# Patient Record
Sex: Male | Born: 1960 | Race: Black or African American | Hispanic: No | Marital: Married | Smoking: Never smoker
Health system: Southern US, Community
[De-identification: ages and names within clinical notes are randomized; demographics above are authoritative.]

---

## 2004-04-29 ENCOUNTER — Inpatient Hospital Stay (HOSPITAL_COMMUNITY): Admission: EM | Admit: 2004-04-29 | Discharge: 2004-05-13 | Payer: Self-pay | Admitting: *Deleted

## 2004-11-19 IMAGING — CR DG ABDOMEN 2V
2 series · 2 of 2 positions shown · non-contrast
Comparison: none

CLINICAL DATA: abdominal distention
TWO VIEW ABDOMEN
There are distended small bowel loops seen within the mid- and upper abdomen with air-fluid levels on the erect view.  There is a small amount of gas and stool within the right colon.  The appearance is worrisome for partial small bowel obstruction.  There is an occulta spina bifida at the S1 level and also at the T12 level.
IMPRESSION
Markedly distended small bowel loops within the mid- and upper abdomen with a small amount of stool and gas within the colon- findings worrisome for partial small bowel obstruction.

[view not recorded (1 of 2)]
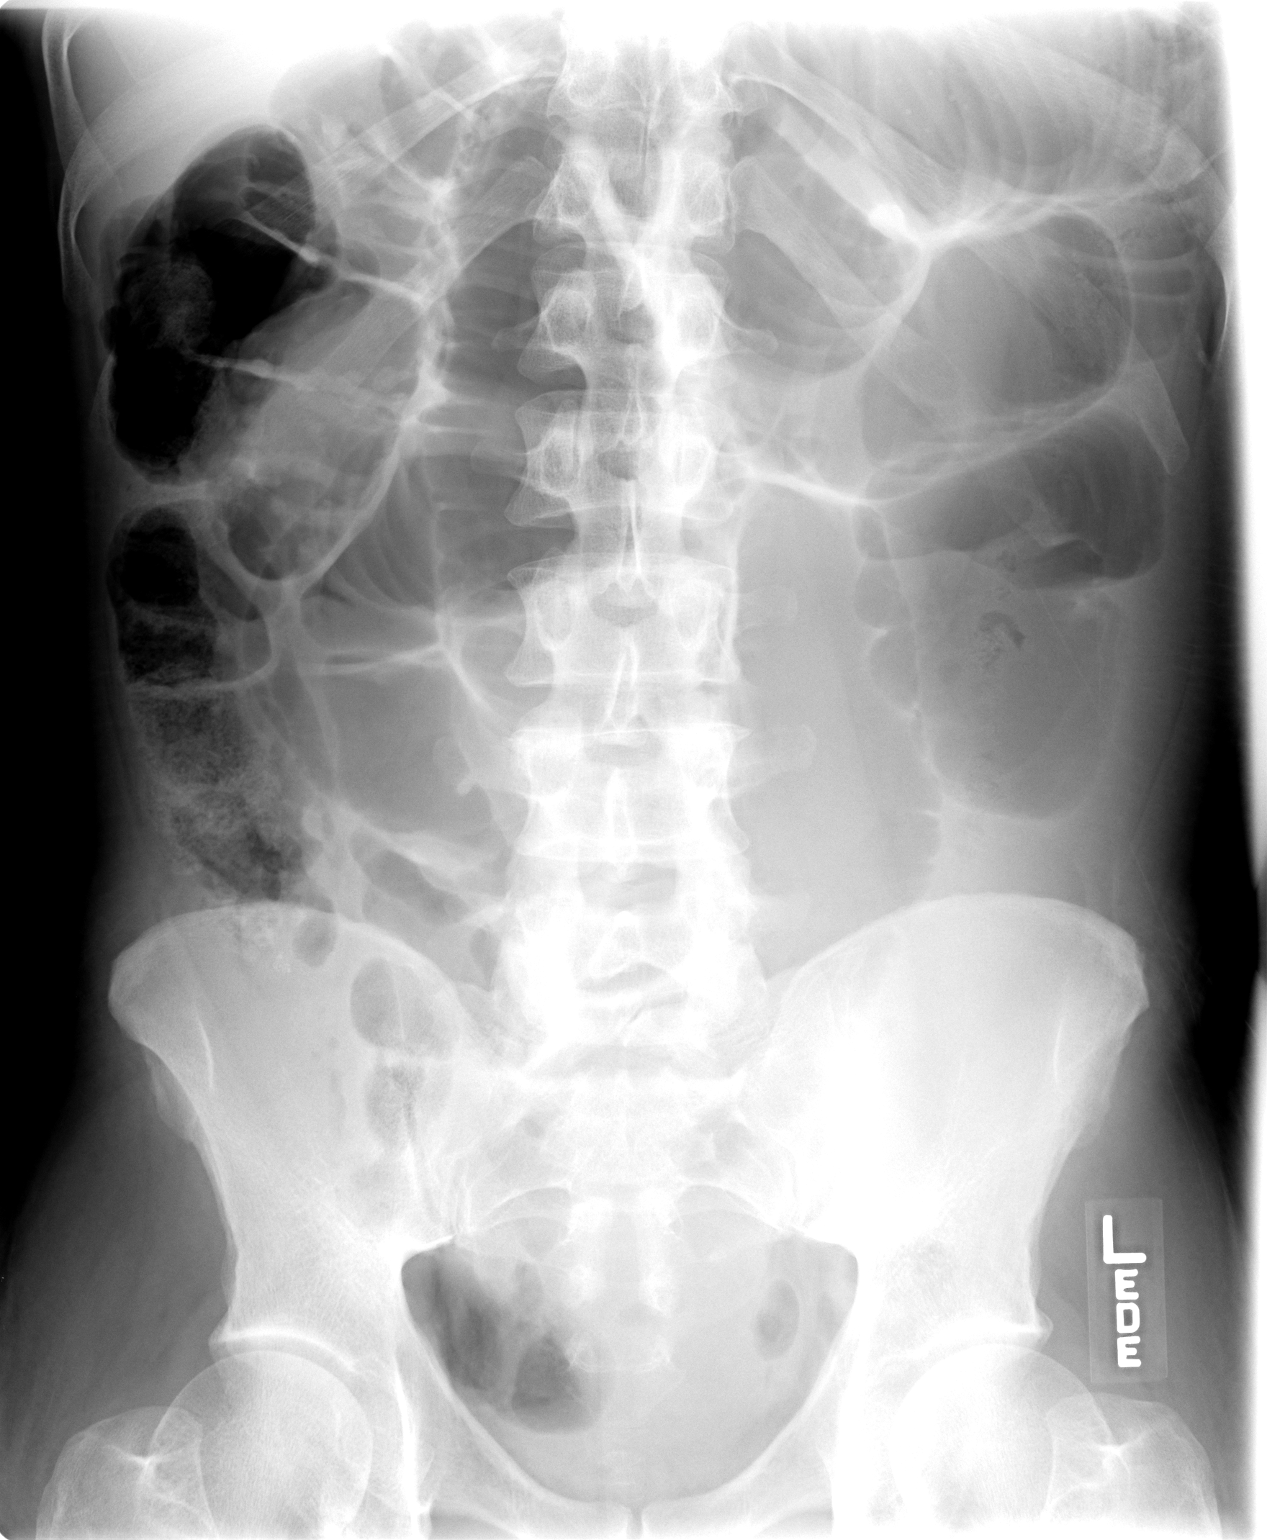

[view not recorded (2 of 2)]
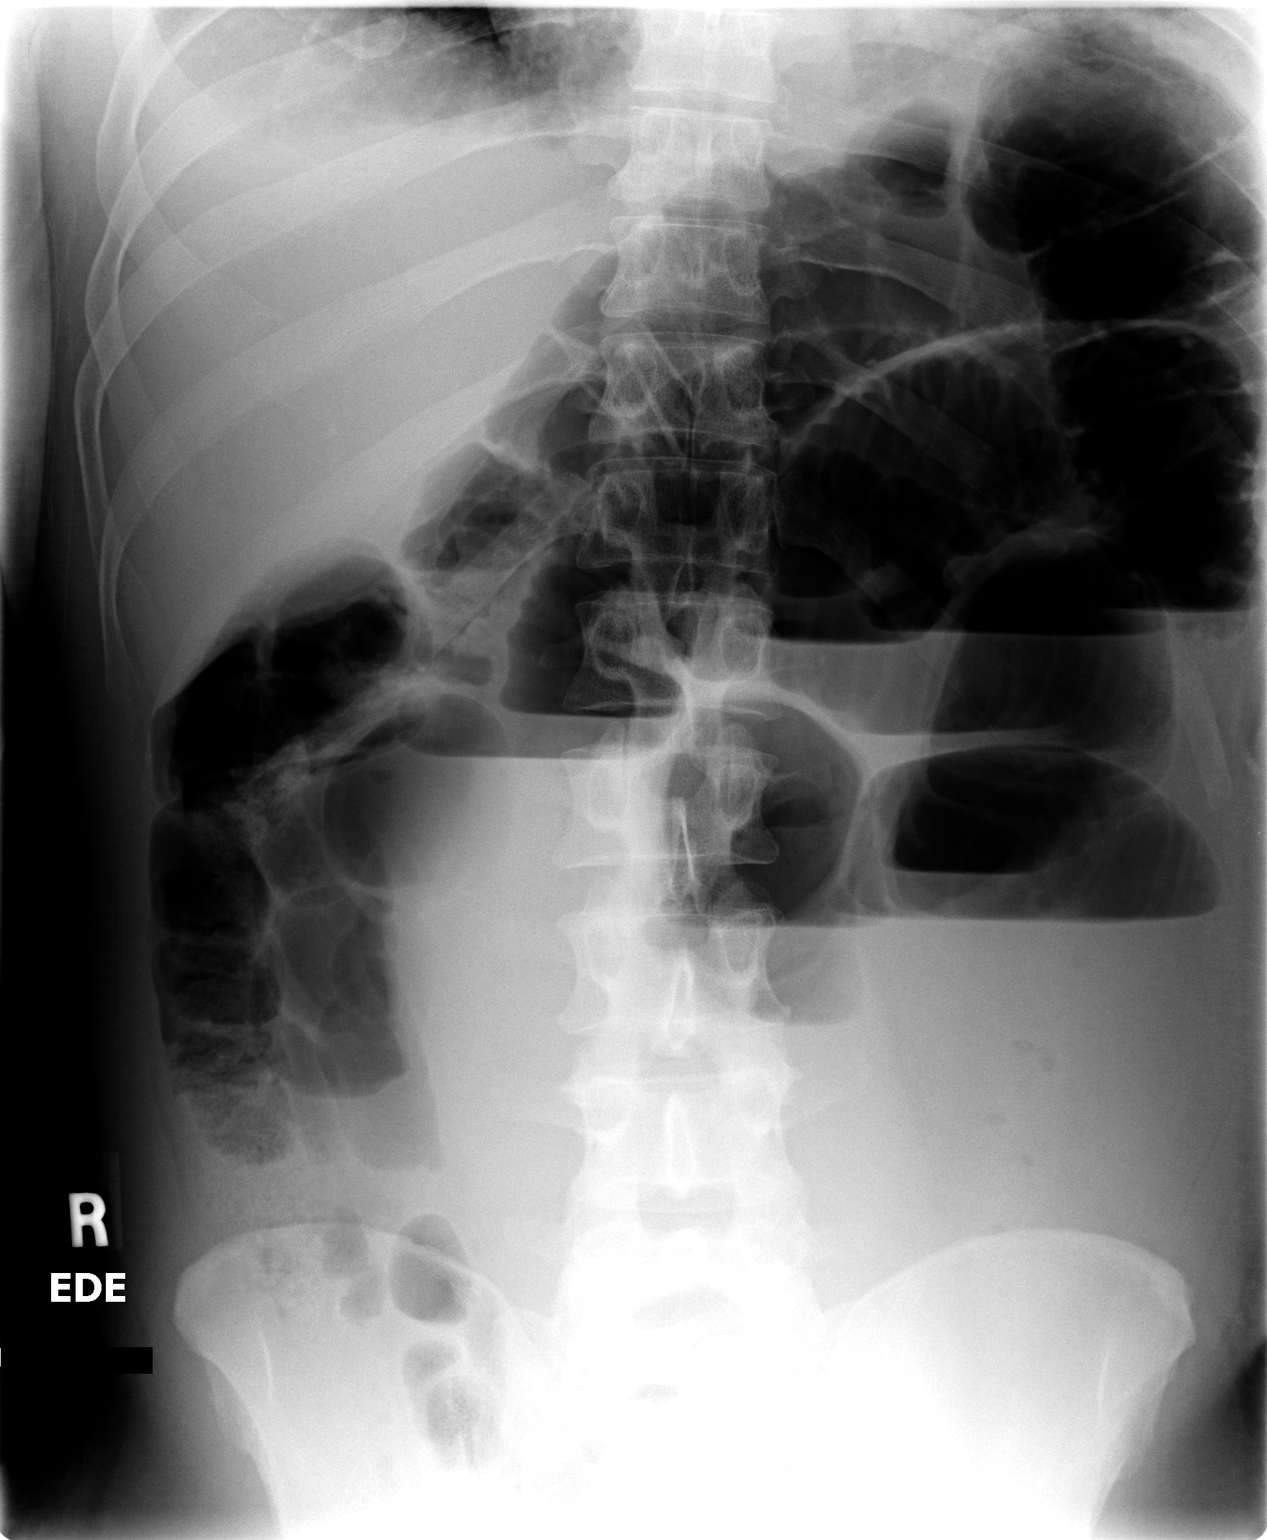

[2 of 2 positions shown; findings below may reference images not displayed]

## 2004-11-22 IMAGING — CT CT PELVIS W/ CM
1 series · 15 of 32 positions shown, 19 images · IV contrast (agent unspecified)
Comparison: none

CLINICAL DATA: Appendicitis; status-post appendectomy.  Abdominal distention. 
CT ABDOMEN WITH CONTRAST

[Series 2: abd pelvis · axial · 0.66mm/px · z∈[-461,-26]mm · 15 of 128 slices shown, 19 images]
[im 9/128  soft-tissue]
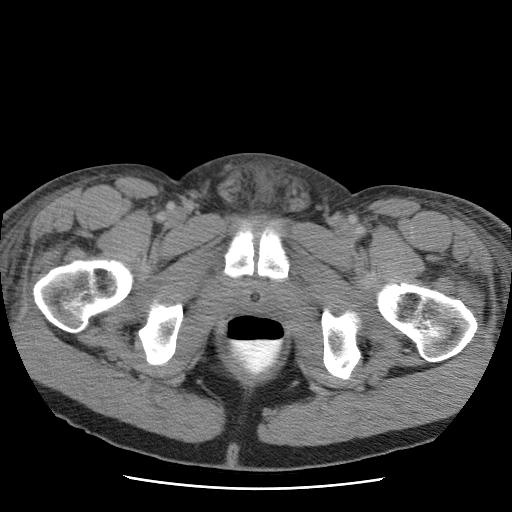
[im 9/128  bone]
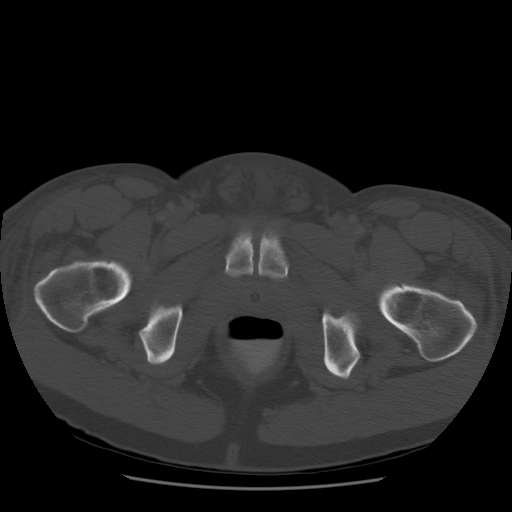
[im 17/128  soft-tissue]
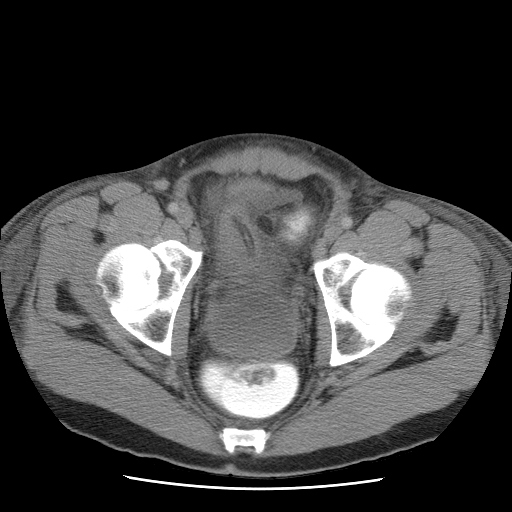
[im 25/128  soft-tissue]
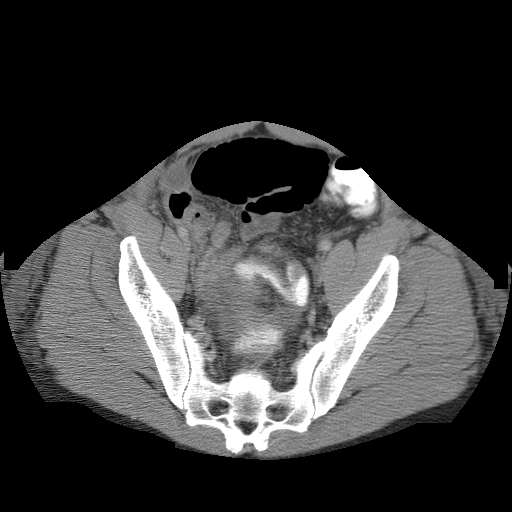
[im 37/128  soft-tissue]
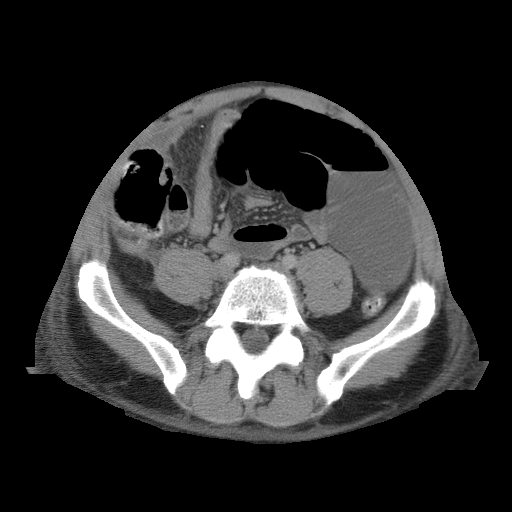
[im 46/128  soft-tissue]
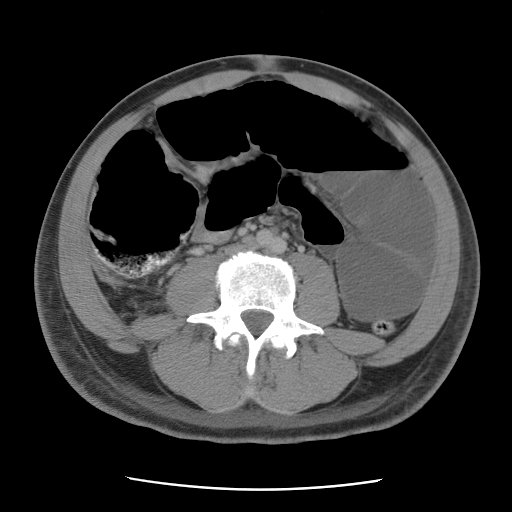
[im 54/128  soft-tissue]
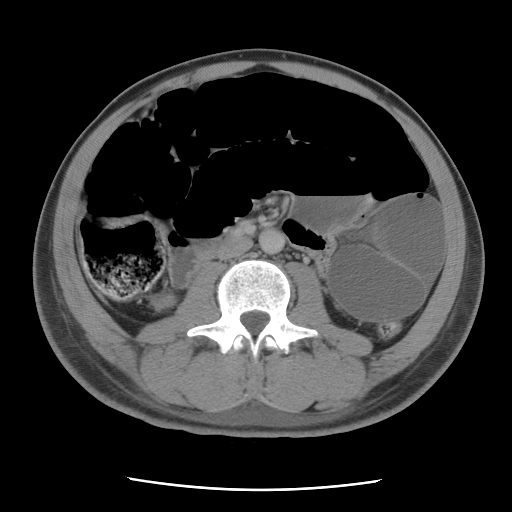
[im 66/128  soft-tissue]
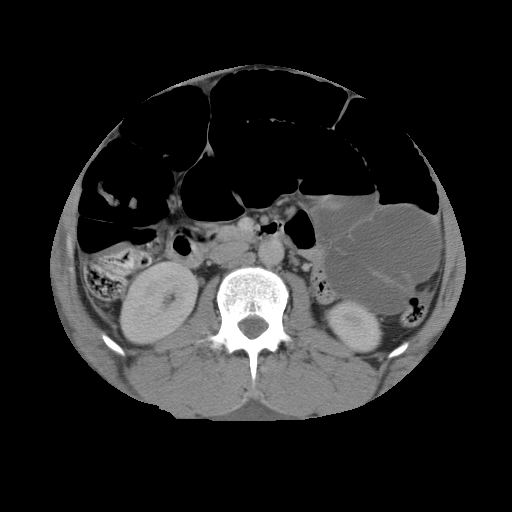
[im 74/128  soft-tissue]
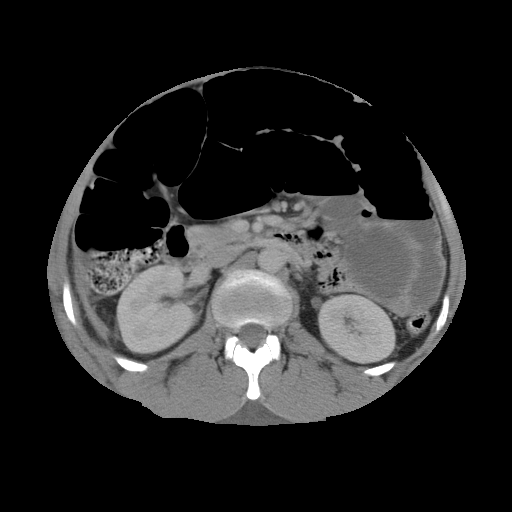
[im 82/128  soft-tissue]
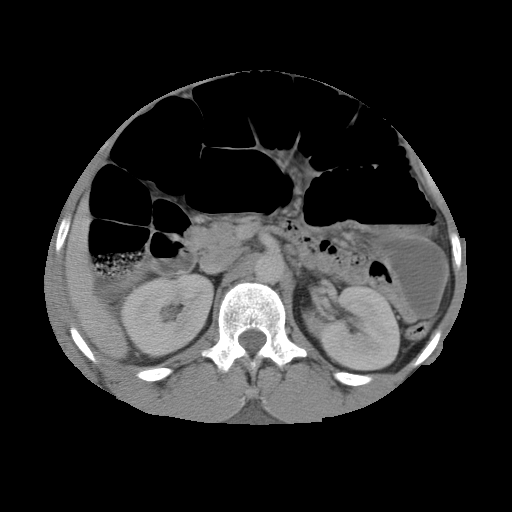
[im 82/128  bone]
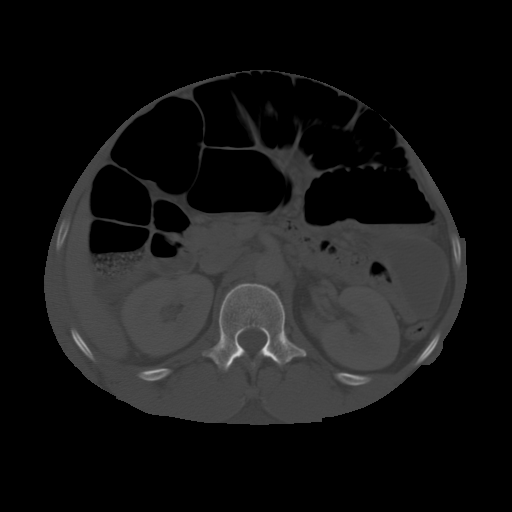
[im 91/128  soft-tissue]
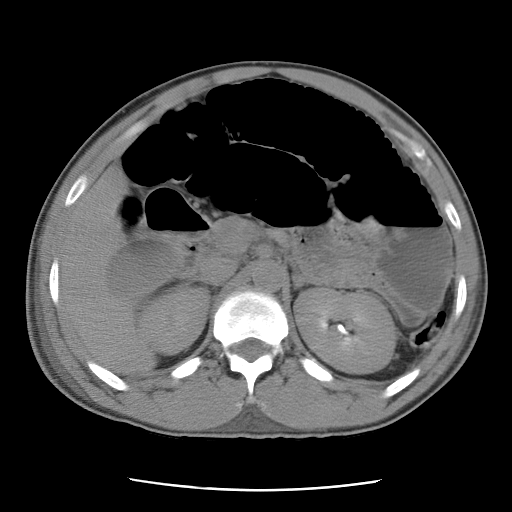
[im 103/128  soft-tissue]
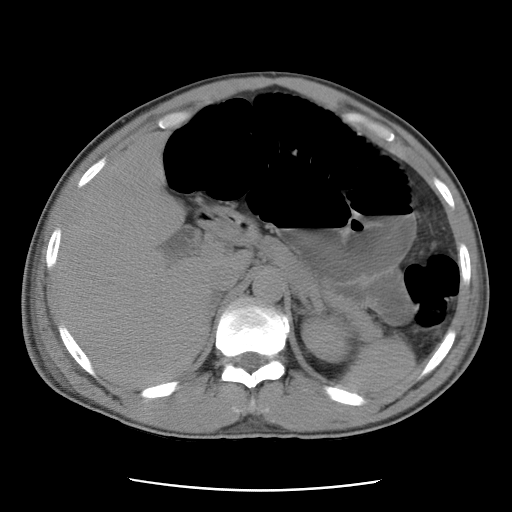
[im 111/128  soft-tissue]
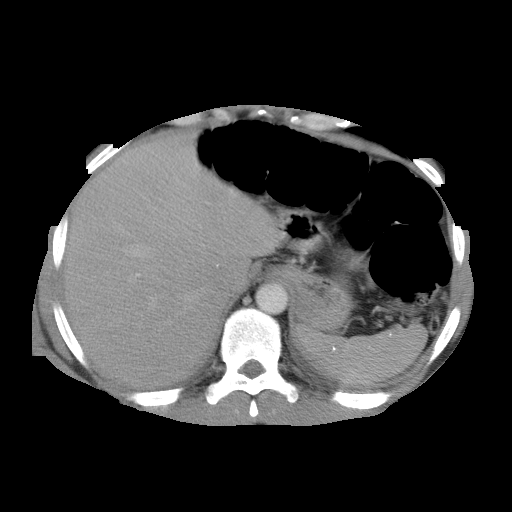
[im 111/128  lung]
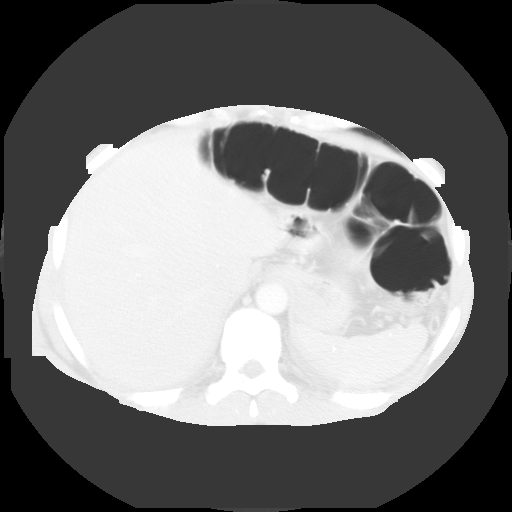
[im 115/128  lung]
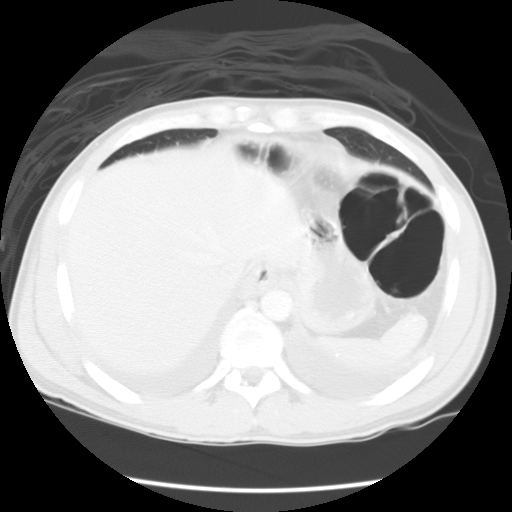
[im 119/128  soft-tissue]
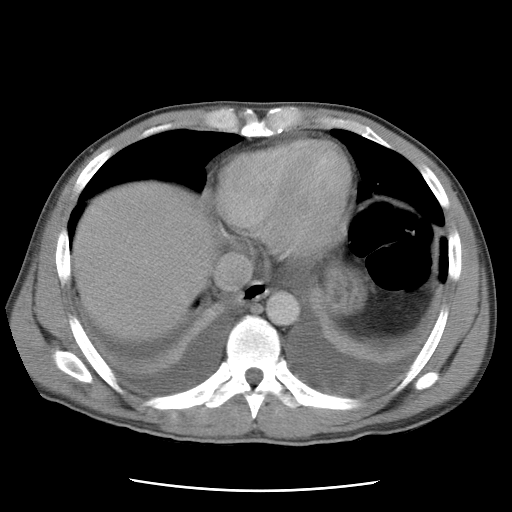
[im 119/128  lung]
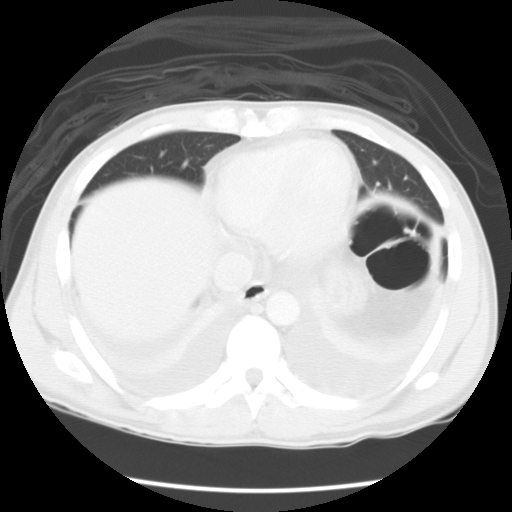
[im 123/128  lung]
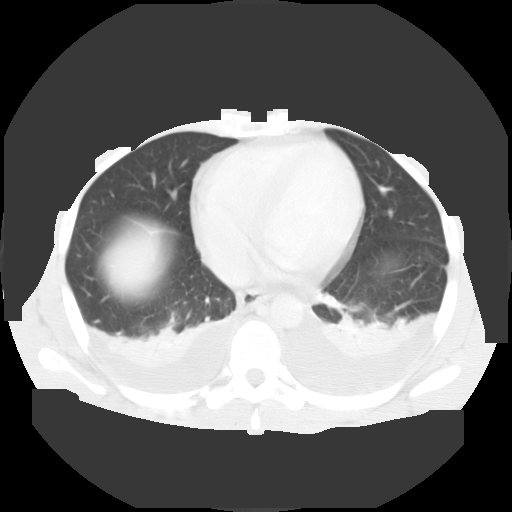

[15 of 32 positions shown; findings below may reference images not displayed]

FINDINGS: Spiral CT after 100 ml Omnipaque 300 IV and rectal contrast.  Comparison 04/29/04 preoperative study.  The visualized lung bases demonstrate bilateral pleural effusions with dependent atelectasis in the posterior aspect of both lower lobes.  Unremarkable liver, gallbladder, adrenal glands, pancreas, kidneys.  Multiple 1 to 2 mm calcifications in the spleen suggest old granulomatous disease.  There are multiple gas dilated loops of small bowel.  There are a few decompressed distal loops of small bowel.  The rectal contrast did not reflux into small bowel.  There is no free air.  No discrete extraluminal fluid collections identified to suggest abscess. 
IMPRESSION
1.  Distal small bowel obstruction without discrete transition point or etiology. 
2.  Bilateral pleural effusions. 
CT PELVIS WITH CONTRAST
FINDINGS: The rectal contrast refluxed only as far as the distal descending colon.  The descending colon is decompressed.  There is gaseous distention of ascending and transverse portions of the colon.  Just inferior to the cecum is a 1 x 5 cm peritoneal fluid collection which may represent a small post-operative collection or conceivably small abscess.  This does not appear to be contiguous with small bowel although without any small bowel contrast it is hard to completely exclude that this might represent a small decompressed loop of small bowel.  The urinary bladder is decompressed by Foley catheter.  There is a moderate amount of fluid in the cul-de-sac. 
IMPRESSION
1.  Probable  1 x 5 cm fluid collection just inferior to the cecum. 
2.  Moderate amount of pelvic fluid.
3.  Gaseous distention of ascending and transverse portions of the colon, decompressed more distally.

## 2017-11-08 ENCOUNTER — Ambulatory Visit (HOSPITAL_COMMUNITY)
Admission: EM | Admit: 2017-11-08 | Discharge: 2017-11-08 | Disposition: A | Discharge status: Home / Self Care | Payer: BLUE CROSS/BLUE SHIELD | Attending: Family Medicine | Admitting: Family Medicine

## 2017-11-08 ENCOUNTER — Encounter (HOSPITAL_COMMUNITY): Payer: Self-pay | Admitting: Emergency Medicine

## 2017-11-08 DIAGNOSIS — R202 Paresthesia of skin: Secondary | ICD-10-CM | POA: Diagnosis present

## 2017-11-08 DIAGNOSIS — Z202 Contact with and (suspected) exposure to infections with a predominantly sexual mode of transmission: Secondary | ICD-10-CM | POA: Diagnosis not present

## 2017-11-08 DIAGNOSIS — N433 Hydrocele, unspecified: Secondary | ICD-10-CM | POA: Insufficient documentation

## 2017-11-08 MED ORDER — AZITHROMYCIN 250 MG PO TABS
1000.0000 mg | ORAL_TABLET | Freq: Once | ORAL | Status: AC
  Administered 2017-11-08: 1000 mg via ORAL

## 2017-11-08 MED ORDER — AZITHROMYCIN 250 MG PO TABS
ORAL_TABLET | ORAL | Status: AC
  Filled 2017-11-08: qty 4

## 2017-11-08 NOTE — ED Provider Notes (Signed)
  [facility]   [id number] 11/08/17 Arrival Time: 1434   SUBJECTIVE:  Mark Boyd is a 56 y.o. male who presents to the urgent care with complaint of some tingling in the penis.  No discharge, fever, joint pain or sore throat.  Has had right swollen scrotum x 2 years.  Aram CandelaBrick mason   History reviewed. No pertinent past medical history. Family History  Problem Relation Age of Onset  . Hypertension Mother    Social History   Socioeconomic History  . Marital status: Married    Spouse name: Not on file  . Number of children: Not on file  . Years of education: Not on file  . Highest education level: Not on file  Social Needs  . Financial resource strain: Not on file  . Food insecurity - worry: Not on file  . Food insecurity - inability: Not on file  . Transportation needs - medical: Not on file  . Transportation needs - non-medical: Not on file  Occupational History  . Not on file  Tobacco Use  . Smoking status: Never Smoker  . Smokeless tobacco: Never Used  Substance and Sexual Activity  . Alcohol use: Yes    Frequency: Never  . Drug use: Yes    Types: Marijuana  . Sexual activity: Not on file  Other Topics Concern  . Not on file  Social History Narrative  . Not on file   No outpatient medications have been marked as taking for the 11/08/17 encounter [facility](Hospital Encounter).   No Known Allergies    ROS: As per HPI, remainder of ROS negative.   OBJECTIVE:   Vitals:   11/08/17 1448  BP: (!) 162/106  Pulse: 74  Resp: 16  Temp: 98.3 F (36.8 C)  TempSrc: Oral  SpO2: 100%     General appearance: alert; no distress Eyes: PERRL; EOMI; conjunctiva normal HENT: normocephalic; atraumatic; TMs normal, canal normal, external ears normal without trauma; nasal mucosa normal; oral mucosa normal Neck: supple Right hydrocele, uncircumcised. Back: no CVA tenderness Extremities: no cyanosis or edema; symmetrical with no gross deformities Skin: warm  and dry Neurologic: normal gait; grossly normal Psychological: alert and cooperative; normal mood and affect      Labs:  No results found for this or any previous visit.  Labs Reviewed  URINE CYTOLOGY ANCILLARY ONLY    No results found.     ASSESSMENT & PLAN:  1. Hydrocele, unspecified hydrocele type   2. STD exposure     Meds ordered this encounter  Medications  . azithromycin (ZITHROMAX) tablet 1,000 mg    Reviewed expectations re: course of current medical issues. Questions answered. Outlined signs and symptoms indicating need for more acute intervention. Patient verbalized understanding. After Visit Summary given.    Procedures:      Elvina SidleLauenstein, Kriss Ishler, MD 11/08/17 [phone]

## 2017-11-08 NOTE — Discharge Instructions (Signed)
Our final test results should be ready by Monday and we will call you if there is need for further treatment.

## 2017-11-08 NOTE — ED Triage Notes (Signed)
PT C/O: here for STD check .... Reports partner called him and told him that she is being treated for Gon/Chlam  ONSET:   SX ALSO INCLUDE: asymptomatic   DENIES: asymptomatic   TAKING MEDS: none  A&O x4... NAD... Ambulatory

## 2017-11-11 LAB — URINE CYTOLOGY ANCILLARY ONLY
Chlamydia: NEGATIVE
Neisseria Gonorrhea: NEGATIVE
Trichomonas: NEGATIVE
